# Patient Record
Sex: Male | Born: 1985
Health system: Southern US, Community
[De-identification: ages and names within clinical notes are randomized; demographics above are authoritative.]

## PROBLEM LIST (undated history)

## (undated) DIAGNOSIS — S060XAA Concussion with loss of consciousness status unknown, initial encounter: Secondary | ICD-10-CM

## (undated) DIAGNOSIS — S060X9A Concussion with loss of consciousness of unspecified duration, initial encounter: Secondary | ICD-10-CM

## (undated) HISTORY — PX: TONSILLECTOMY: SUR1361

## (undated) HISTORY — DX: Concussion with loss of consciousness status unknown, initial encounter: S06.0XAA

## (undated) HISTORY — DX: Concussion with loss of consciousness of unspecified duration, initial encounter: S06.0X9A

---
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
## 2005-02-19 ENCOUNTER — Ambulatory Visit (HOSPITAL_COMMUNITY): Admission: RE | Admit: 2005-02-19 | Discharge: 2005-02-19 | Payer: Self-pay | Admitting: Gastroenterology

## 2005-04-30 ENCOUNTER — Encounter: Admission: RE | Admit: 2005-04-30 | Discharge: 2005-04-30 | Payer: Self-pay | Admitting: Gastroenterology

## 2014-05-14 ENCOUNTER — Ambulatory Visit (INDEPENDENT_AMBULATORY_CARE_PROVIDER_SITE_OTHER): Payer: Self-pay | Admitting: Physician Assistant

## 2014-05-14 VITALS — BP 114/72 | HR 85 | Temp 97.9°F | Resp 16 | Ht 65.0 in | Wt 111.4 lb

## 2014-05-14 DIAGNOSIS — Z0000 Encounter for general adult medical examination without abnormal findings: Secondary | ICD-10-CM

## 2014-05-14 NOTE — Progress Notes (Signed)
   Subjective:    Patient ID: Benjamin Baird, male    DOB: [birth year], 29 y.o.   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   Denies any pmh. No psh. Not on any medications. Has never had less than a 2 year card issued. Denies cp, sob, presyncope, syncope.   BP wnl. HR regular rate/rhythm. Normal ua. Vision (corrected) and hearing exams normal.   Review of Systems See HPI.     Objective:   Physical Exam  Constitutional: He is oriented to person, place, and time. He appears well-developed and well-nourished.  Non-toxic appearance. He does not have a sickly appearance. He does not appear ill. No distress.  BP 114/72 mmHg  Pulse 85  Temp(Src) 97.9 F (36.6 C) (Oral)  Resp 16  Ht 5\' 5"  (1.651 m)  Wt 111 lb 6.4 oz (50.531 kg)  BMI 18.54 kg/m2  SpO2 98%   HENT:  Right Ear: Tympanic membrane normal.  Left Ear: Tympanic membrane normal.  Mouth/Throat: Uvula is midline, oropharynx is clear and moist and mucous membranes are normal.  Eyes: Conjunctivae and EOM are normal. Pupils are equal, round, and reactive to light.  Neck: Trachea normal and normal range of motion. Carotid bruit is not present.  Cardiovascular: Normal rate, regular rhythm and normal heart sounds.  Exam reveals no gallop.   No murmur heard. Pulses:      Dorsalis pedis pulses are 2+ on the right side, and 2+ on the left side.       Posterior tibial pulses are 2+ on the right side, and 2+ on the left side.  Pulmonary/Chest: Effort normal and breath sounds normal. No tachypnea.  Abdominal: Soft. Normal appearance and bowel sounds are normal. There is no hepatosplenomegaly. There is no tenderness. No hernia.  Musculoskeletal: Normal range of motion.  Neurological: He is alert and oriented to person, place, and time. He has normal strength. No cranial nerve deficit or sensory deficit. He displays a negative Romberg sign. Coordination and gait normal.  Reflex Scores:      Patellar reflexes are 2+ on the right  side and 2+ on the left side. Psychiatric: He has a normal mood and affect. His speech is normal and behavior is normal.      Assessment & Plan:   89 yom presents for DOT physical.   Physical exam --no concerning pmh or physical exam findings.  --2 year card given  Donnajean Lopesodd M. Keyonia Gluth, PA-C Physician Assistant-Certified [facility]  05/14/2014 5:25 PM

## 2015-04-25 DIAGNOSIS — Z23 Encounter for immunization: Secondary | ICD-10-CM | POA: Diagnosis not present

## 2015-04-25 DIAGNOSIS — G8929 Other chronic pain: Secondary | ICD-10-CM | POA: Diagnosis not present

## 2015-04-25 DIAGNOSIS — M542 Cervicalgia: Secondary | ICD-10-CM | POA: Diagnosis not present

## 2015-05-07 DIAGNOSIS — M542 Cervicalgia: Secondary | ICD-10-CM | POA: Diagnosis not present

## 2015-05-07 DIAGNOSIS — G8929 Other chronic pain: Secondary | ICD-10-CM | POA: Diagnosis not present

## 2015-05-19 DIAGNOSIS — G8929 Other chronic pain: Secondary | ICD-10-CM | POA: Diagnosis not present

## 2015-05-19 DIAGNOSIS — M542 Cervicalgia: Secondary | ICD-10-CM | POA: Diagnosis not present

## 2015-05-19 DIAGNOSIS — M5021 Other cervical disc displacement,  high cervical region: Secondary | ICD-10-CM | POA: Diagnosis not present

## 2015-05-19 DIAGNOSIS — M5031 Other cervical disc degeneration,  high cervical region: Secondary | ICD-10-CM | POA: Diagnosis not present

## 2015-06-02 DIAGNOSIS — F458 Other somatoform disorders: Secondary | ICD-10-CM | POA: Diagnosis not present

## 2015-06-02 DIAGNOSIS — K219 Gastro-esophageal reflux disease without esophagitis: Secondary | ICD-10-CM | POA: Diagnosis not present

## 2015-07-29 DIAGNOSIS — F458 Other somatoform disorders: Secondary | ICD-10-CM | POA: Diagnosis not present

## 2015-07-29 DIAGNOSIS — K219 Gastro-esophageal reflux disease without esophagitis: Secondary | ICD-10-CM | POA: Diagnosis not present

## 2015-08-02 DIAGNOSIS — J069 Acute upper respiratory infection, unspecified: Secondary | ICD-10-CM | POA: Diagnosis not present

## 2015-08-02 DIAGNOSIS — J029 Acute pharyngitis, unspecified: Secondary | ICD-10-CM | POA: Diagnosis not present

## 2015-10-11 ENCOUNTER — Emergency Department (HOSPITAL_COMMUNITY): Payer: BLUE CROSS/BLUE SHIELD

## 2015-10-11 ENCOUNTER — Encounter (HOSPITAL_COMMUNITY): Payer: Self-pay | Admitting: Emergency Medicine

## 2015-10-11 ENCOUNTER — Emergency Department (HOSPITAL_COMMUNITY)
Admission: EM | Admit: 2015-10-11 | Discharge: 2015-10-11 | Disposition: A | Discharge status: Home / Self Care | Payer: BLUE CROSS/BLUE SHIELD | Attending: Emergency Medicine | Admitting: Emergency Medicine

## 2015-10-11 DIAGNOSIS — Z79899 Other long term (current) drug therapy: Secondary | ICD-10-CM | POA: Insufficient documentation

## 2015-10-11 DIAGNOSIS — R51 Headache: Secondary | ICD-10-CM | POA: Diagnosis not present

## 2015-10-11 DIAGNOSIS — S0990XA Unspecified injury of head, initial encounter: Secondary | ICD-10-CM | POA: Diagnosis not present

## 2015-10-11 DIAGNOSIS — R519 Headache, unspecified: Secondary | ICD-10-CM

## 2015-10-11 LAB — I-STAT CHEM 8, ED
BUN: 10 mg/dL (ref 6–20)
CALCIUM ION: 1.18 mmol/L (ref 1.15–1.40)
CREATININE: 0.9 mg/dL (ref 0.61–1.24)
Chloride: 101 mmol/L (ref 101–111)
Glucose, Bld: 72 mg/dL (ref 65–99)
HCT: 45 % (ref 39.0–52.0)
Hemoglobin: 15.3 g/dL (ref 13.0–17.0)
Potassium: 4.1 mmol/L (ref 3.5–5.1)
Sodium: 141 mmol/L (ref 135–145)
TCO2: 29 mmol/L (ref 0–100)

## 2015-10-11 NOTE — ED Triage Notes (Signed)
Pt c/o posterior head injury about 2 weeks ago, no LOC, unsure what he hit his head on. No fall, just hit head. C/o throbbing pain and "bump" at site of injury, no other symtpoms until Sunday when he noted slurred speech, ataxia. Pt reports loss of depth perception to vision noticed on waking, last seen normal 2130 yesterday.

## 2015-10-11 NOTE — ED Provider Notes (Signed)
[facility]-EMERGENCY DEPT Provider Note   CSN: [id number] Arrival date & time: 10/11/15  [id number]     History   Chief Complaint Chief Complaint  Patient presents with  . Head Injury    HPI Benjamin Baird is a 30 y.o. male.  HPI  30 year old male presents for evaluation of headache, blurry vision, ataxia, and slurred speech. Patient states that about 2 weeks ago he murmurs hitting his posterior head. He's not sure exactly what happened or what he hit it on. He knows he did not lose consciousness or fall. He states he hits his head frequently and didn't think anything else of it. However 3 days ago he started having ataxia and feeling off balance. Also noticed a mild posterior headache at the time. The headache has been constant and never gets higher than a 5/10. He has had multiple worse headaches in the past. 2 days ago he started noticing some intermittent slurred speech and trouble speaking. Yesterday he noticed difficulty with his vision which he describes as having poor depth perception all of a sudden. He states there are no visual field cuts or unilateral symptoms. Has also had some photophobia and phonophobia. No fevers. No weakness/numbness. No neck pain/stiffness  History reviewed. No pertinent past medical history.  There are no active problems to display for this patient.   History reviewed. No pertinent surgical history.     Home Medications    Prior to Admission medications   Medication Sig Start Date End Date Taking? Authorizing Provider  ibuprofen (ADVIL,MOTRIN) 200 MG tablet Take 200 mg by mouth daily as needed for headache.   Yes Historical Provider, MD  loratadine (CLARITIN) 10 MG tablet Take 10 mg by mouth daily as needed for allergies.   Yes Historical Provider, MD  omeprazole (PRILOSEC) 40 MG capsule Take 40 mg by mouth daily. 08/30/15  Yes Historical Provider, MD    Family History Family History  Problem Relation Age of Onset  . Hypertension Father     Social  History Social History  Substance Use Topics  . Smoking status: Never Smoker  . Smokeless tobacco: Current User  . Alcohol use No     Allergies   Review of patient's allergies indicates no known allergies.   Review of Systems Review of Systems  Eyes: Positive for photophobia and visual disturbance. Negative for pain.  Gastrointestinal: Negative for nausea.  Musculoskeletal: Positive for gait problem. Negative for neck pain and neck stiffness.  Neurological: Positive for headaches. Negative for weakness and numbness.  All other systems reviewed and are negative.    Physical Exam Updated Vital Signs BP 115/75 (BP Location: Left Arm)   Pulse (!) 52   Temp 98 F (36.7 C) (Oral)   Resp 16   SpO2 100%   Physical Exam  Constitutional: He is oriented to person, place, and time. He appears well-developed and well-nourished.  HENT:  Head: Normocephalic and atraumatic.  Right Ear: External ear normal.  Left Ear: External ear normal.  Nose: Nose normal.  Eyes: EOM are normal. Pupils are equal, round, and reactive to light. Right eye exhibits no discharge. Left eye exhibits no discharge.  Neck: Normal range of motion. Neck supple.  Cardiovascular: Normal rate, regular rhythm and normal heart sounds.   Pulmonary/Chest: Effort normal and breath sounds normal.  Abdominal: Soft. He exhibits no distension. There is no tenderness.  Musculoskeletal: He exhibits no edema.  Neurological: He is alert and oriented to person, place, and time.  CN 3-12 grossly intact.  5/5 strength in all 4 extremities. Grossly normal sensation. Normal finger to nose.   Skin: Skin is warm and dry.  Nursing note and vitals reviewed.    ED Treatments / Results  Labs (all labs ordered are listed, but only abnormal results are displayed) Labs Reviewed  I-STAT CHEM 8, ED    EKG  EKG Interpretation None       Radiology Mr Brain Wo Contrast  Result Date: 10/11/2015 CLINICAL DATA:  Posterior head  injury 2 weeks ago. Throbbing pain. Slurred speech and ataxia 2 days ago. EXAM: MRI HEAD WITHOUT CONTRAST TECHNIQUE: Multiplanar, multiecho pulse sequences of the brain and surrounding structures were obtained without intravenous contrast. COMPARISON:  None available FINDINGS: Brain: No acute infarct or intraparenchymal hemorrhage. The midline structures are normal. There are a few scattered foci of hyperintense T2-weighted signal within the deep white matter, in a nonspecific distribution. While this may be seen in the setting of migraine headaches or early chronic microvascular disease, is also seen in normal patients of this age. No mass lesion or midline shift. No hydrocephalus or extra-axial fluid collection. Vascular: Major intracranial flow voids are preserved. No evidence of chronic microhemorrhage or amyloid angiopathy. Skull and upper cervical spine: The visualized skull base, calvarium, upper cervical spine and extracranial soft tissues are normal. Sinuses/Orbits: No fluid levels or advanced mucosal thickening. No mastoid effusion. Normal orbits. IMPRESSION: 1. No acute intracranial abnormality. 2. 2-3 nonspecific foci of white matter hyperintensity. This may be seen in the setting of migraine headaches or early chronic microvascular disease. It is also seen, at times, in normal patients of this age. The pattern is not typical of demyelinating disease. Electronically Signed   By: Deatra Robinson M.D.   On: 10/11/2015 14:08    Procedures Procedures (including critical care time)  Medications Ordered in ED Medications - No data to display   Initial Impression / Assessment and Plan / ED Course  I have reviewed the triage vital signs and the nursing notes.  Pertinent labs & imaging results that were available during my care of the patient were reviewed by me and considered in my medical decision making (see chart for details).  Clinical Course  Comment By Time  Will consult neuro. Declines pain  meds. Exam currently unremarkable.  Pricilla Loveless, MD 10/03 [phone]  Dr. Desmond Lope recommends MR brain w/o. Otherwise probably post-concussive syndrome. My suspicion for SAH, meningitis, vertebral artery dissection (no neck pain/stiffness) is low.  Pricilla Loveless, MD 10/03 0930    MRI does not show any acute disease including no stroke, mass, bleed, or MS. Patient was encouraged to try ibuprofen or Tylenol given that if this is a complex migraine and might help. Will refer to neurology and discussed return precautions. Likely this is all postconcussive.  Final Clinical Impressions(s) / ED Diagnoses   Final diagnoses:  Occipital headache    New Prescriptions Discharge Medication List as of 10/11/2015  2:45 PM       Pricilla Loveless, MD 10/11/15 1703

## 2015-10-11 NOTE — ED Notes (Signed)
Patient transported to MRI 

## 2015-10-11 NOTE — ED Notes (Signed)
Pt called for triage, not in lobby. Family member states pt went to bathroom,

## 2015-10-14 ENCOUNTER — Encounter: Payer: Self-pay | Admitting: Neurology

## 2015-10-14 ENCOUNTER — Ambulatory Visit (INDEPENDENT_AMBULATORY_CARE_PROVIDER_SITE_OTHER): Payer: BLUE CROSS/BLUE SHIELD | Admitting: Neurology

## 2015-10-14 VITALS — BP 118/72 | HR 72 | Temp 98.1°F | Ht 65.0 in | Wt 114.5 lb

## 2015-10-14 DIAGNOSIS — F0781 Postconcussional syndrome: Secondary | ICD-10-CM | POA: Diagnosis not present

## 2015-10-14 MED ORDER — NAPROXEN 500 MG PO TABS: 500.0000 mg | ORAL_TABLET | Freq: Two times a day (BID) | ORAL | 2 refills | Status: DC | PRN

## 2015-10-14 MED ORDER — AMITRIPTYLINE HCL 10 MG PO TABS: 10.0000 mg | ORAL_TABLET | Freq: Every day | ORAL | 3 refills | Status: DC

## 2015-10-14 NOTE — Patient Instructions (Signed)
1. Start amitriptyline 10mg : take 1 tablet every night 2. Take Naproxen 500mg  twice a day as needed only for severe headaches. Do not take over the counter pain medication more than 2-3 times a week, otherwise headaches may worsen 3. Physical rest and brain rest are of utmost importance after a concussion 4. Follow-up in 2 months, call for any changes
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
## 2015-12-15 ENCOUNTER — Ambulatory Visit: Payer: BLUE CROSS/BLUE SHIELD | Admitting: Neurology

## 2015-12-16 ENCOUNTER — Ambulatory Visit (INDEPENDENT_AMBULATORY_CARE_PROVIDER_SITE_OTHER): Payer: BLUE CROSS/BLUE SHIELD | Admitting: Neurology

## 2015-12-16 ENCOUNTER — Encounter: Payer: Self-pay | Admitting: Neurology

## 2015-12-16 VITALS — BP 112/62 | HR 91 | Ht 65.0 in | Wt 115.1 lb

## 2015-12-16 DIAGNOSIS — F0781 Postconcussional syndrome: Secondary | ICD-10-CM | POA: Diagnosis not present
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
## 2015-12-16 NOTE — Patient Instructions (Signed)
For headaches, you may take any over the counter pain medication, but if you notice you are needing to take it more than 2-3 times a week, please call our office, because using these medications too frequently can worsen headaches instead of making them better.

## 2016-03-17 DIAGNOSIS — A0839 Other viral enteritis: Secondary | ICD-10-CM | POA: Diagnosis not present

## 2016-03-17 DIAGNOSIS — J028 Acute pharyngitis due to other specified organisms: Secondary | ICD-10-CM | POA: Diagnosis not present

## 2016-07-31 DIAGNOSIS — Z23 Encounter for immunization: Secondary | ICD-10-CM | POA: Diagnosis not present

## 2016-08-17 DIAGNOSIS — K219 Gastro-esophageal reflux disease without esophagitis: Secondary | ICD-10-CM | POA: Diagnosis not present

## 2016-08-17 DIAGNOSIS — F458 Other somatoform disorders: Secondary | ICD-10-CM | POA: Diagnosis not present

## 2016-08-24 DIAGNOSIS — M5031 Other cervical disc degeneration,  high cervical region: Secondary | ICD-10-CM | POA: Diagnosis not present

## 2016-09-25 DIAGNOSIS — M5031 Other cervical disc degeneration,  high cervical region: Secondary | ICD-10-CM | POA: Diagnosis not present

## 2016-10-15 DIAGNOSIS — M5021 Other cervical disc displacement,  high cervical region: Secondary | ICD-10-CM | POA: Diagnosis not present

## 2016-11-20 DIAGNOSIS — Z23 Encounter for immunization: Secondary | ICD-10-CM | POA: Diagnosis not present

## 2017-02-04 DIAGNOSIS — K219 Gastro-esophageal reflux disease without esophagitis: Secondary | ICD-10-CM | POA: Diagnosis not present

## 2017-02-04 DIAGNOSIS — M899 Disorder of bone, unspecified: Secondary | ICD-10-CM | POA: Diagnosis not present

## 2017-05-01 DIAGNOSIS — A084 Viral intestinal infection, unspecified: Secondary | ICD-10-CM | POA: Diagnosis not present

## 2017-07-05 DIAGNOSIS — R05 Cough: Secondary | ICD-10-CM | POA: Diagnosis not present

## 2017-07-05 DIAGNOSIS — K219 Gastro-esophageal reflux disease without esophagitis: Secondary | ICD-10-CM | POA: Diagnosis not present

## 2017-12-11 DIAGNOSIS — H109 Unspecified conjunctivitis: Secondary | ICD-10-CM | POA: Diagnosis not present

## 2018-01-31 DIAGNOSIS — R05 Cough: Secondary | ICD-10-CM | POA: Diagnosis not present

## 2018-06-04 DIAGNOSIS — M79604 Pain in right leg: Secondary | ICD-10-CM | POA: Diagnosis not present

## 2018-06-23 DIAGNOSIS — R112 Nausea with vomiting, unspecified: Secondary | ICD-10-CM | POA: Diagnosis not present

## 2018-06-23 DIAGNOSIS — R5383 Other fatigue: Secondary | ICD-10-CM | POA: Diagnosis not present

## 2018-09-04 DIAGNOSIS — M79604 Pain in right leg: Secondary | ICD-10-CM | POA: Diagnosis not present

## 2018-09-04 DIAGNOSIS — R202 Paresthesia of skin: Secondary | ICD-10-CM | POA: Diagnosis not present
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
## 2018-10-14 ENCOUNTER — Other Ambulatory Visit: Payer: Self-pay | Admitting: Neurology

## 2018-10-14 ENCOUNTER — Other Ambulatory Visit: Payer: Self-pay

## 2018-10-14 ENCOUNTER — Ambulatory Visit: Payer: BC Managed Care – PPO | Admitting: Neurology

## 2018-10-14 ENCOUNTER — Ambulatory Visit
Admission: RE | Admit: 2018-10-14 | Discharge: 2018-10-14 | Disposition: A | Discharge status: Home / Self Care | Payer: BC Managed Care – PPO | Source: Ambulatory Visit | Attending: Neurology | Admitting: Neurology

## 2018-10-14 ENCOUNTER — Encounter: Payer: Self-pay | Admitting: Neurology

## 2018-10-14 VITALS — BP 104/65 | HR 76 | Temp 97.5°F | Ht 64.0 in | Wt 113.0 lb

## 2018-10-14 DIAGNOSIS — G8929 Other chronic pain: Secondary | ICD-10-CM

## 2018-10-14 DIAGNOSIS — R200 Anesthesia of skin: Secondary | ICD-10-CM

## 2018-10-14 DIAGNOSIS — M25561 Pain in right knee: Secondary | ICD-10-CM

## 2018-10-14 NOTE — Patient Instructions (Addendum)
[facility] [address] [phone]   Common Peroneal Nerve Entrapment  Common peroneal nerve entrapment is a condition that can make it hard to lift a foot. The condition results from pressure on a nerve in the lower leg called the common peroneal nerve. Your common peroneal nerve provides feeling to your outer lower leg and foot. It also supplies the muscles that move your foot and toes upward and outward. What are the causes? This condition may be caused by:  Sitting cross-legged, squatting, or kneeling for long periods of time.  A hard, direct hit to the side of the lower leg.  Swelling from a knee injury.  A break (fracture) in one of the lower leg bones.  Wearing a boot or cast that ends just below the knee.  A growth or cyst near the nerve. What increases the risk? This condition is more likely to develop in people who play:  Contact sports, such as football or hockey.  Sports where you wear high and stiff boots, such as skiing. What are the signs or symptoms? Symptoms of this condition include:  Trouble lifting your foot up (foot drop).  Tripping often.  Your foot hitting the ground harder than normal as you walk.  Numbness, tingling, or pain in the outside of the knee, outside of the lower leg, and top of the foot.  Sensitivity to pressure on the front or side of the leg. How is this diagnosed? This condition may be diagnosed based on:  Your symptoms.  Your medical history.  A physical exam.  Tests, such as: ? An X-ray to check the bones of your knee and leg. ? MRI to check tendons that attach to the side of your knee. ? An ultrasound to check for a growth or cyst. ? An electromyogram (EMG) to check your nerves. During your physical exam, your health care provider will check for numbness in your leg and test the strength of your lower leg muscles. He or she may tap the side of your lower leg to see if that causes tingling. How is this treated?  Treatment for this condition may include:  Avoiding activities that make symptoms worse.  Using a brace to hold up your foot and toes.  Taking anti-inflammatory pain medicines to relieve swelling and lessen pain.  Having medicines injected into your ankle joint to lessen pain and swelling.  Doing exercises to help you regain or maintain movement (physical therapy).  Surgery to take pressure off the nerve. This may be needed if there is no improvement after 2-3 months or if there is a growth pushing on the nerve.  Returning gradually to full activity. Follow these instructions at home: If you have a brace:  Wear it as told by your health care provider. Remove it only as told by your health care provider.  Loosen the brace if your toes tingle, become numb, or turn cold and blue.  Keep the brace clean.  If the brace is not waterproof: ? Do not let it get wet. ? Cover it with a watertight covering when you take a bath or a shower.  Ask your health care provider when it is safe to drive with a brace on your foot. Activity  Return to your normal activities as told by your health care provider. Ask your health care provider what activities are safe for you.  Do not do any activities that make pain or swelling worse.  Do exercises as told by your health care provider.  General instructions  Take over-the-counter and prescription medicines only as told by your health care provider.  Do not put your full weight on your knee until your health care provider says you can. Use crutches as directed by your health care provider.  Keep all follow-up visits as told by your health care provider. This is important. How is this prevented?  Wear supportive footwear that is appropriate for your athletic activity.  Avoid athletic activities that cause ankle pain or swelling.  Wear protective padding over your lower legs when playing contact sports.  Make sure your boots do not put extra  pressure on the area just below your knees.  Do not sit cross-legged for long periods of time. Contact a health care provider if:  Your symptoms do not get better in 2-3 months.  The weakness or numbness in your leg or foot gets worse. Summary  Common peroneal nerve entrapment is a condition that results from pressure on a nerve in the lower leg called the common peroneal nerve.  This condition may be caused by a hard hit, swelling, a fracture, or a cyst in the lower leg.  Treatment may include rest, a brace, medicines, and physical therapy. Sometimes surgery is needed.  Do not do any activities that make pain or swelling worse. This information is not intended to replace advice given to you by your health care provider. Make sure you discuss any questions you have with your health care provider. Document Released: 12/25/2004 Document Revised: 11/04/2017 Document Reviewed: 11/04/2017 Elsevier Patient Education  2020 [employer].

## 2018-10-15 ENCOUNTER — Telehealth: Payer: Self-pay | Admitting: *Deleted

## 2018-10-15 NOTE — Telephone Encounter (Signed)
-----   Message from Melvenia Beam, MD sent at 10/14/2018  2:20 PM EDT ----- xr shows no cause for symptoms

## 2018-10-15 NOTE — Telephone Encounter (Signed)
Called and spoke with pt about xray results per AA,MD note. Pt verbalized understanding.

## 2018-10-18 LAB — METHYLMALONIC ACID, SERUM: Methylmalonic Acid: 72 nmol/L (ref 0–378)

## 2018-10-18 LAB — B12 AND FOLATE PANEL
Folate: 13.2 ng/mL (ref >3.0)
Vitamin B-12: 663 pg/mL (ref 232–1245)

## 2019-12-15 DIAGNOSIS — R059 Cough, unspecified: Secondary | ICD-10-CM | POA: Diagnosis not present

## 2019-12-15 DIAGNOSIS — R112 Nausea with vomiting, unspecified: Secondary | ICD-10-CM | POA: Diagnosis not present

## 2019-12-15 DIAGNOSIS — J029 Acute pharyngitis, unspecified: Secondary | ICD-10-CM | POA: Diagnosis not present

## 2019-12-23 DIAGNOSIS — M5412 Radiculopathy, cervical region: Secondary | ICD-10-CM | POA: Diagnosis not present

## 2020-01-21 DIAGNOSIS — U071 COVID-19: Secondary | ICD-10-CM | POA: Diagnosis not present

## 2020-01-21 DIAGNOSIS — J069 Acute upper respiratory infection, unspecified: Secondary | ICD-10-CM | POA: Diagnosis not present

## 2020-03-12 DIAGNOSIS — M5412 Radiculopathy, cervical region: Secondary | ICD-10-CM | POA: Diagnosis not present

## 2020-03-15 DIAGNOSIS — M5412 Radiculopathy, cervical region: Secondary | ICD-10-CM | POA: Diagnosis not present

## 2020-03-18 DIAGNOSIS — M5030 Other cervical disc degeneration, unspecified cervical region: Secondary | ICD-10-CM | POA: Diagnosis not present

## 2020-08-29 ENCOUNTER — Other Ambulatory Visit: Payer: Self-pay

## 2020-08-29 ENCOUNTER — Encounter (HOSPITAL_COMMUNITY): Payer: Self-pay

## 2020-08-29 ENCOUNTER — Emergency Department (HOSPITAL_COMMUNITY): Payer: BC Managed Care – PPO

## 2020-08-29 ENCOUNTER — Emergency Department (HOSPITAL_COMMUNITY)
Admission: EM | Admit: 2020-08-29 | Discharge: 2020-08-30 | Disposition: A | Discharge status: Home / Self Care | Payer: BC Managed Care – PPO | Attending: Emergency Medicine | Admitting: Emergency Medicine

## 2020-08-29 DIAGNOSIS — R079 Chest pain, unspecified: Secondary | ICD-10-CM | POA: Diagnosis not present

## 2020-08-29 DIAGNOSIS — R0789 Other chest pain: Secondary | ICD-10-CM | POA: Diagnosis not present

## 2020-08-29 LAB — COMPREHENSIVE METABOLIC PANEL
ALT: 16 U/L (ref 0–44)
AST: 24 U/L (ref 15–41)
Albumin: 4.3 g/dL (ref 3.5–5.0)
Alkaline Phosphatase: 51 U/L (ref 38–126)
Anion gap: 9 (ref 5–15)
BUN: 9 mg/dL (ref 6–20)
CO2: 26 mmol/L (ref 22–32)
Calcium: 9.4 mg/dL (ref 8.9–10.3)
Chloride: 103 mmol/L (ref 98–111)
Creatinine, Ser: 0.91 mg/dL (ref 0.61–1.24)
GFR, Estimated: >60 mL/min (ref >60)
Glucose, Bld: 94 mg/dL (ref 70–99)
Potassium: 3.5 mmol/L (ref 3.5–5.1)
Sodium: 138 mmol/L (ref 135–145)
Total Bilirubin: 0.6 mg/dL (ref 0.3–1.2)
Total Protein: 7 g/dL (ref 6.5–8.1)

## 2020-08-29 LAB — CBC WITH DIFFERENTIAL/PLATELET
Abs Immature Granulocytes: 0.03 10*3/uL (ref 0.00–0.07)
Basophils Absolute: 0.1 10*3/uL (ref 0.0–0.1)
Basophils Relative: 1 %
Eosinophils Absolute: 0.2 10*3/uL (ref 0.0–0.5)
Eosinophils Relative: 2 %
HCT: 43.6 % (ref 39.0–52.0)
Hemoglobin: 14.6 g/dL (ref 13.0–17.0)
Immature Granulocytes: 0 %
Lymphocytes Relative: 17 %
Lymphs Abs: 1.8 10*3/uL (ref 0.7–4.0)
MCH: 28.9 pg (ref 26.0–34.0)
MCHC: 33.5 g/dL (ref 30.0–36.0)
MCV: 86.3 fL (ref 80.0–100.0)
Monocytes Absolute: 0.7 10*3/uL (ref 0.1–1.0)
Monocytes Relative: 7 %
Neutro Abs: 7.8 10*3/uL — ABNORMAL HIGH (ref 1.7–7.7)
Neutrophils Relative %: 73 %
Platelets: 241 10*3/uL (ref 150–400)
RBC: 5.05 MIL/uL (ref 4.22–5.81)
RDW: 12.5 % (ref 11.5–15.5)
WBC: 10.6 10*3/uL — ABNORMAL HIGH (ref 4.0–10.5)
nRBC: 0 % (ref 0.0–0.2)

## 2020-08-29 LAB — TROPONIN I (HIGH SENSITIVITY)
Troponin I (High Sensitivity): 3 ng/L (ref <18)
Troponin I (High Sensitivity): 4 ng/L (ref <18)

## 2020-08-29 NOTE — ED Triage Notes (Signed)
Patient complains of left anterior chest pain since Thursday. Was referred from ortho due to the pain, patient went there because he thought he had pulled a muscle. Patient alert and oriented, no associated symptoms

## 2020-08-29 NOTE — ED Provider Notes (Signed)
Emergency Medicine Provider Triage Evaluation Note  Benjamin Baird , a 35 y.o. male  was evaluated in triage.  Pt complains of chest pain since Thursday.  He states that he went to orthopedics today due to the pain thinking he had pulled a muscle and they referred him here.  He denies any specific injury.  His pain was present Thursday, worse Friday and Saturday and better yesterday however does return today.  He denies any nausea or vomiting, pain does not radiate or move.  He states that he has been short of breath when he goes from inside an air conditioned building to outside where he thought he might.  No leg swelling, no recent surgeries or immobilizations.  No hemoptysis.  No recent known sick contacts.  Denies any drug use.  Does use smokeless tobacco.;   Review of Systems  Positive: Chest pain Negative: Vomiting,  Physical Exam  BP (!) 131/97 (BP Location: Right Arm)   Pulse 89   Temp 98.4 F (36.9 C) (Oral)   Resp 17   SpO2 100%  Gen:   Awake, no distress   Resp:  Normal effort  MSK:   Moves extremities without difficulty  Other:  Regular rate rhythm, no murmurs gallops or rubs.  Medical Decision Making  Medically screening exam initiated at 5:05 PM.  Appropriate orders placed.  JACQUELYN ANTONY was informed that the remainder of the evaluation will be completed by another provider, this initial triage assessment does not replace that evaluation, and the importance of remaining in the ED until their evaluation is complete.  Note: Portions of this report may have been transcribed using voice recognition software. Every effort was made to ensure accuracy; however, inadvertent computerized transcription errors may be present     Norman Clay 08/29/20 1707    Koleen Distance, MD 08/29/20 [phone]

## 2020-08-30 NOTE — Discharge Instructions (Addendum)
You were evaluated in the Emergency Department and after careful evaluation, we did not find any emergent condition requiring admission or further testing in the hospital.  Your exam/testing today was overall reassuring.  Recommend Tylenol or Motrin as needed for pain.  Please return to the Emergency Department if you experience any worsening of your condition.  Thank you for allowing [place] to be a part of your care.

## 2020-08-30 NOTE — ED Provider Notes (Signed)
[facility]EMER[facility]ENC[facility] DEPT [facility] Emergency Department Provider Note MRN:  [id number]  Arrival date & time: 08/30/20     Chief Complaint   Chest pain History of Present Illness   Benjamin Baird is a 35 y.o. year-old male with no pertinent past medical history presenting to the ED with chief complaint of chest pain.  Location: Left upper chest Duration: 3 to 4 days Onset: Gradual Timing: Constant Description: Ache Severity: Moderate Exacerbating/Alleviating Factors: Worse with certain movements or positions Associated Symptoms: None Pertinent Negatives: No dizziness or diaphoresis, no nausea vomiting, no trouble breathing, no leg pain or swelling, no abdominal pain, no numbness or weakness to the arms or legs   Review of Systems  A complete 10 system review of systems was obtained and all systems are negative except as noted in the HPI and PMH.   Patient's Health History    Past Medical History:  Diagnosis Date   Concussion     Past Surgical History:  Procedure Laterality Date   TONSILLECTOMY     childhood    Family History  Problem Relation Age of Onset   Hypertension Father    Neuropathy Neg Hx        none that he is aware of    Social History   Socioeconomic History   Marital status: Married    Spouse name: Not on file   Number of children: 1   Years of education: Not on file   Highest education level: High school graduate  Occupational History   Not on file  Tobacco Use   Smoking status: Never   Smokeless tobacco: Current  Vaping Use   Vaping Use: Never used  Substance and Sexual Activity   Alcohol use: Yes    Alcohol/week: 0.0 standard drinks    Comment: "rarely"   Drug use: No   Sexual activity: Not on file  Other Topics Concern   Not on file  Social History Narrative   Lives at home with wife & son   Left handed   Caffeine: several sodas daily   Social Determinants of Health   Financial Resource Strain: Not on file  Food Insecurity: Not  on file  Transportation Needs: Not on file  Physical Activity: Not on file  Stress: Not on file  Social Connections: Not on file  Intimate Partner Violence: Not on file     Physical Exam   Vitals:   08/29/20 2242 08/30/20 0058  BP: 117/69 111/73  Pulse: (!) 51 71  Resp: 20 18  Temp:    SpO2: 100% 100%    CONSTITUTIONAL: Well-appearing, NAD NEURO:  Alert and oriented x 3, no focal deficits EYES:  eyes equal and reactive ENT/NECK:  no LAD, no JVD CARDIO: Regular rate, well-perfused, normal S1 and S2 PULM:  CTAB no wheezing or rhonchi GI/GU:  normal bowel sounds, non-distended, non-tender MSK/SPINE:  No gross deformities, no edema SKIN:  no rash, atraumatic PSYCH:  Appropriate speech and behavior  *Additional and/or pertinent findings included in MDM below  Diagnostic and Interventional Summary    EKG Interpretation  Date/Time:  Monday August 29 2020 16:53:09 EDT Ventricular Rate:  87 PR Interval:  148 QRS Duration: 96 QT Interval:  354 QTC Calculation: 425 R Axis:   120 Text Interpretation: Normal sinus rhythm Right atrial enlargement Right axis deviation Pulmonary disease pattern Incomplete right bundle branch block Minimal voltage criteria for LVH, may be normal variant ( Cornell product ) Abnormal ECG No previous ECGs available Confirmed by  Tilden Fossa [id number]) on 08/30/2020 1:45:22 AM       Labs Reviewed  CBC WITH DIFFERENTIAL/PLATELET - Abnormal; Notable for the following components:      Result Value   WBC 10.6 (*)    Neutro Abs 7.8 (*)    All other components within normal limits  COMPREHENSIVE METABOLIC PANEL  TROPONIN I (HIGH SENSITIVITY)  TROPONIN I (HIGH SENSITIVITY)    DG Chest 2 View  Final Result      Medications - No data to display   Procedures  /  Critical Care Procedures  ED Course and Medical Decision Making  I have reviewed the triage vital signs, the nursing notes, and pertinent available records from the EMR.  Listed above are  laboratory and imaging tests that I personally ordered, reviewed, and interpreted and then considered in my medical decision making (see below for details).  35 year old with positional chest pain, worse with arm movement, consistent with MSK related pain.  EKG reassuring, troponin negative x2.  Nothing to suggest PE or dissection or other emergent process, appropriate for discharge       Elmer Sow. Pilar Plate, MD [facility] Emergency Medicine [facility] [email or web address]  Final Clinical Impressions(s) / ED Diagnoses     ICD-10-CM   1. Chest pain, unspecified type  R07.9       ED Discharge Orders     None        Discharge Instructions Discussed with and Provided to Patient:    Discharge Instructions      You were evaluated in the Emergency Department and after careful evaluation, we did not find any emergent condition requiring admission or further testing in the hospital.  Your exam/testing today was overall reassuring.  Recommend Tylenol or Motrin as needed for pain.  Please return to the Emergency Department if you experience any worsening of your condition.  Thank you for allowing [place] to be a part of your care.        Sabas Sous, MD 08/30/20 [phone]

## 2020-11-14 DIAGNOSIS — J111 Influenza due to unidentified influenza virus with other respiratory manifestations: Secondary | ICD-10-CM | POA: Diagnosis not present

## 2021-04-28 DIAGNOSIS — H40033 Anatomical narrow angle, bilateral: Secondary | ICD-10-CM | POA: Diagnosis not present

## 2021-04-28 DIAGNOSIS — H04123 Dry eye syndrome of bilateral lacrimal glands: Secondary | ICD-10-CM | POA: Diagnosis not present

## 2021-08-06 IMAGING — CR DG KNEE COMPLETE 4+V*R*
4 series · 4 of 4 positions shown · non-contrast
Comparison: None

CLINICAL DATA: The pain, numbness.

EXAM:
RIGHT KNEE - COMPLETE 4+ VIEW

[w knee ap right]
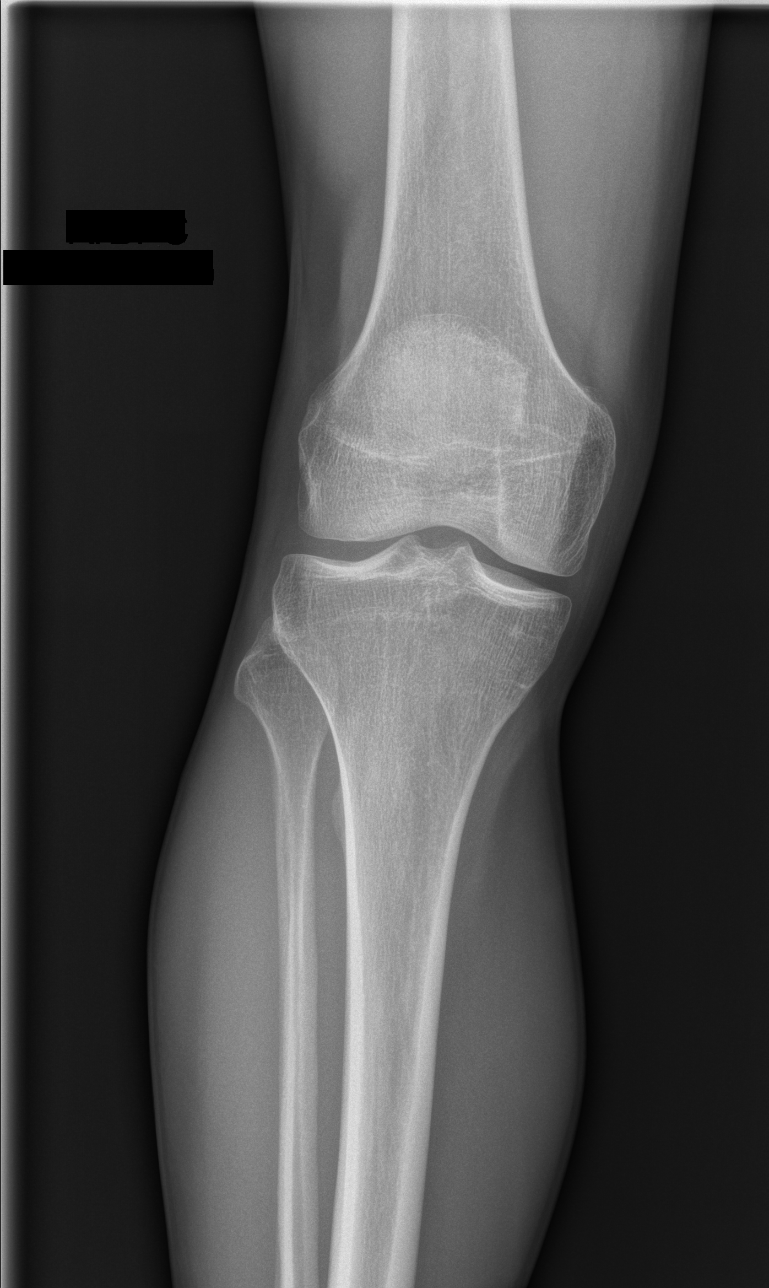

[w knee lat right]
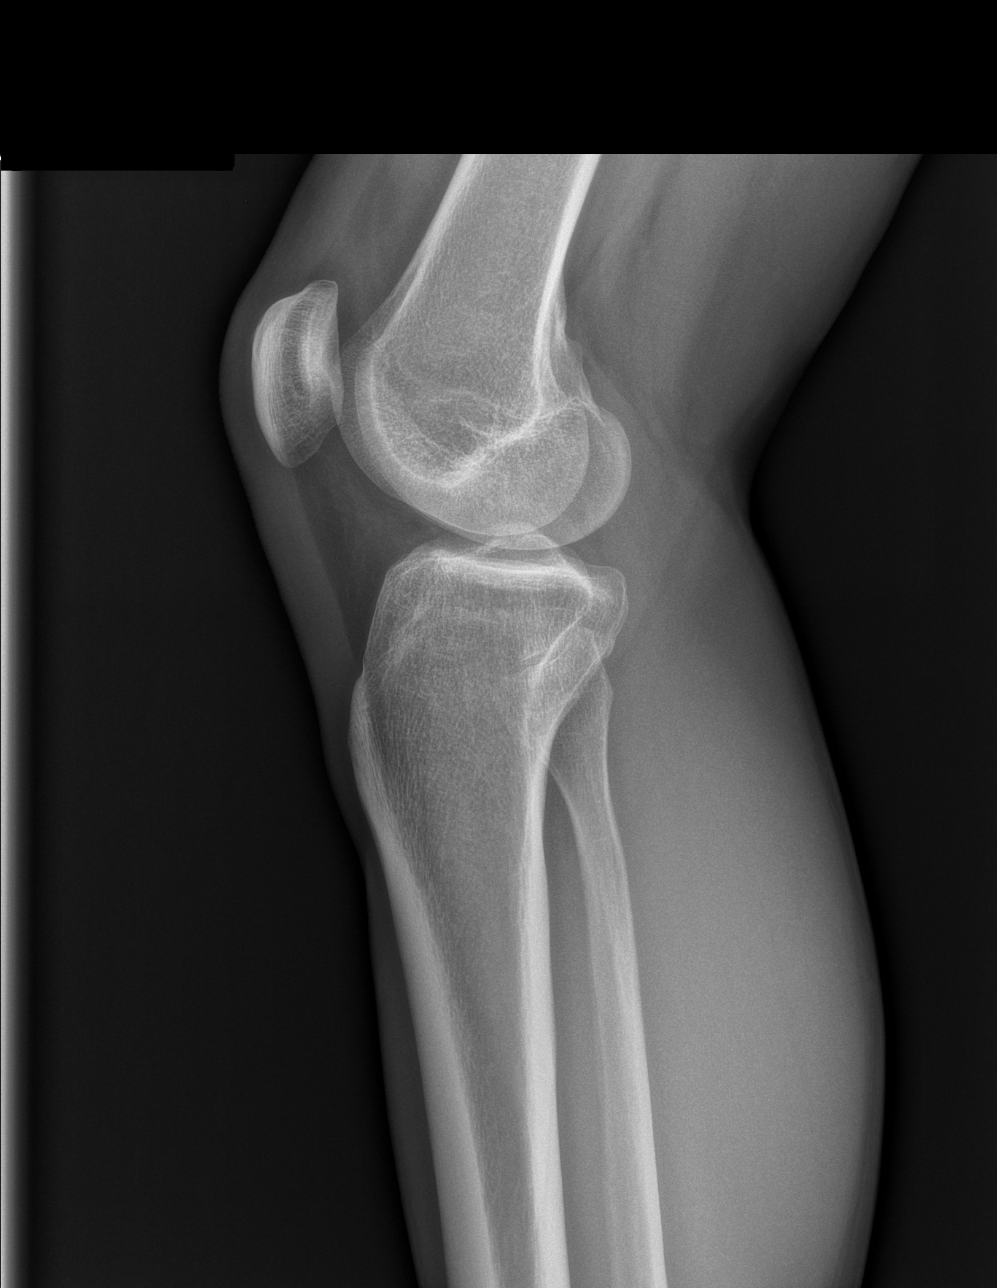

[w knee tunnel pa right]
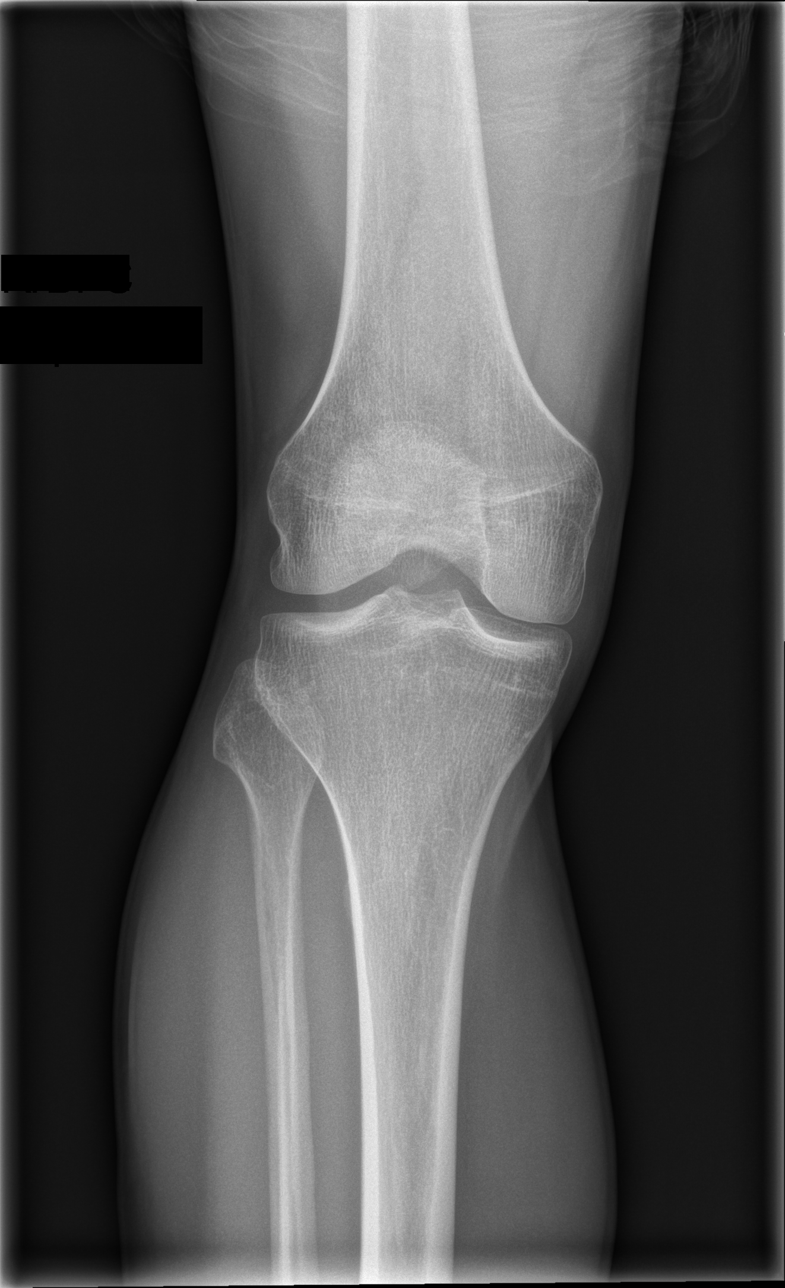

[x knee sunrise right]
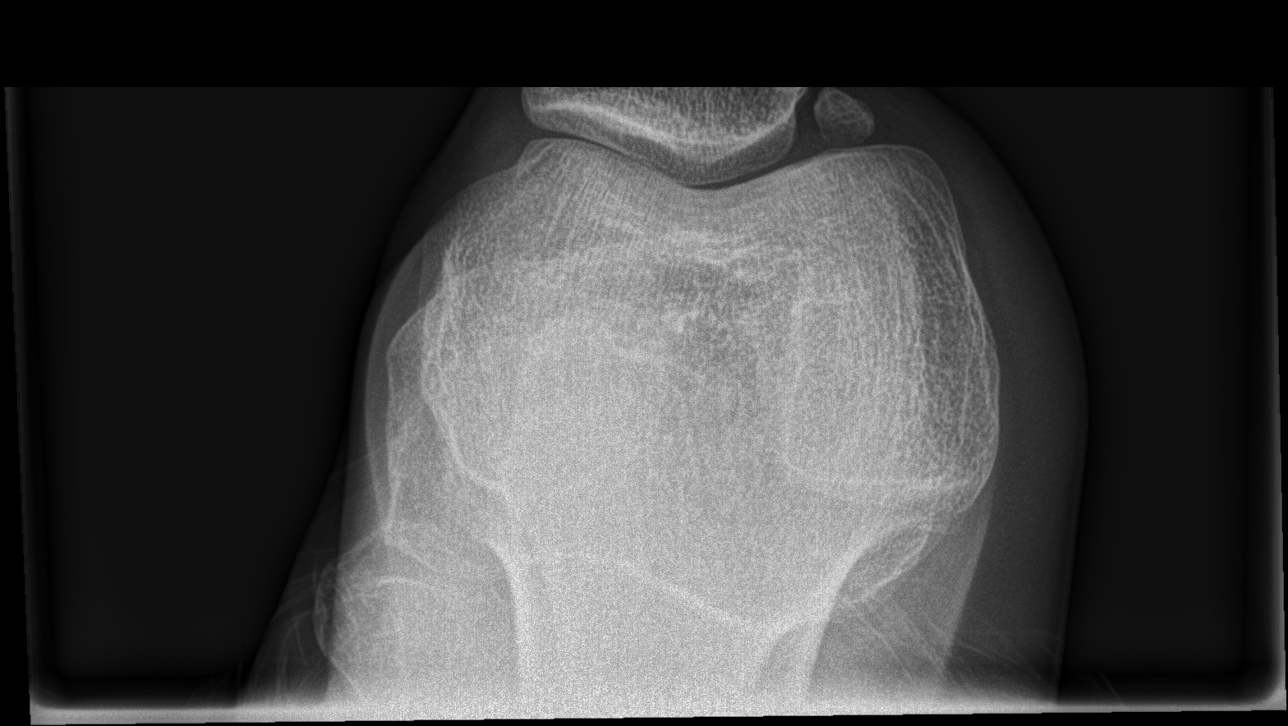

[4 of 4 positions shown; findings below may reference images not displayed]

FINDINGS: No signs of acute fracture or subluxation. There is a well
corticated bone fragment along the medial margin of the patella with
an appearance compatible with accessory ossicle of the patella,
unfused. No signs of joint effusion. Soft tissues are normal.
IMPRESSION: No signs of acute fracture with normal variant patellar ossicle as
described.

## 2021-11-28 DIAGNOSIS — J205 Acute bronchitis due to respiratory syncytial virus: Secondary | ICD-10-CM | POA: Diagnosis not present

## 2021-11-28 DIAGNOSIS — R051 Acute cough: Secondary | ICD-10-CM | POA: Diagnosis not present

## 2021-11-28 DIAGNOSIS — R52 Pain, unspecified: Secondary | ICD-10-CM | POA: Diagnosis not present

## 2021-12-08 DIAGNOSIS — F902 Attention-deficit hyperactivity disorder, combined type: Secondary | ICD-10-CM | POA: Diagnosis not present

## 2022-01-09 DIAGNOSIS — Z13220 Encounter for screening for lipoid disorders: Secondary | ICD-10-CM | POA: Diagnosis not present

## 2022-01-09 DIAGNOSIS — Z0000 Encounter for general adult medical examination without abnormal findings: Secondary | ICD-10-CM | POA: Diagnosis not present

## 2022-01-09 DIAGNOSIS — Z23 Encounter for immunization: Secondary | ICD-10-CM | POA: Diagnosis not present

## 2022-01-09 DIAGNOSIS — F902 Attention-deficit hyperactivity disorder, combined type: Secondary | ICD-10-CM | POA: Diagnosis not present

## 2022-02-27 DIAGNOSIS — M542 Cervicalgia: Secondary | ICD-10-CM | POA: Diagnosis not present

## 2022-02-27 DIAGNOSIS — M5412 Radiculopathy, cervical region: Secondary | ICD-10-CM | POA: Diagnosis not present

## 2022-03-27 DIAGNOSIS — M542 Cervicalgia: Secondary | ICD-10-CM | POA: Diagnosis not present

## 2022-04-02 DIAGNOSIS — K529 Noninfective gastroenteritis and colitis, unspecified: Secondary | ICD-10-CM | POA: Diagnosis not present

## 2022-04-10 DIAGNOSIS — M542 Cervicalgia: Secondary | ICD-10-CM | POA: Diagnosis not present

## 2022-04-17 DIAGNOSIS — M542 Cervicalgia: Secondary | ICD-10-CM | POA: Diagnosis not present

## 2022-04-24 DIAGNOSIS — M542 Cervicalgia: Secondary | ICD-10-CM | POA: Diagnosis not present

## 2022-05-01 DIAGNOSIS — M542 Cervicalgia: Secondary | ICD-10-CM | POA: Diagnosis not present

## 2022-06-18 DIAGNOSIS — J069 Acute upper respiratory infection, unspecified: Secondary | ICD-10-CM | POA: Diagnosis not present

## 2022-11-19 DIAGNOSIS — M5412 Radiculopathy, cervical region: Secondary | ICD-10-CM | POA: Diagnosis not present

## 2022-11-29 DIAGNOSIS — M5412 Radiculopathy, cervical region: Secondary | ICD-10-CM | POA: Diagnosis not present

## 2023-01-11 DIAGNOSIS — Z0000 Encounter for general adult medical examination without abnormal findings: Secondary | ICD-10-CM | POA: Diagnosis not present

## 2023-02-07 DIAGNOSIS — R112 Nausea with vomiting, unspecified: Secondary | ICD-10-CM | POA: Diagnosis not present

## 2023-02-07 DIAGNOSIS — R053 Chronic cough: Secondary | ICD-10-CM | POA: Diagnosis not present

## 2023-02-07 DIAGNOSIS — Z20822 Contact with and (suspected) exposure to covid-19: Secondary | ICD-10-CM | POA: Diagnosis not present

## 2023-04-15 DIAGNOSIS — H9202 Otalgia, left ear: Secondary | ICD-10-CM | POA: Diagnosis not present

## 2023-04-15 DIAGNOSIS — Z681 Body mass index (BMI) 19 or less, adult: Secondary | ICD-10-CM | POA: Diagnosis not present

## 2023-05-30 DIAGNOSIS — F902 Attention-deficit hyperactivity disorder, combined type: Secondary | ICD-10-CM | POA: Diagnosis not present

## 2023-06-22 IMAGING — CR DG CHEST 2V
2 series · 2 of 2 positions shown · non-contrast
Comparison: None.

CLINICAL DATA: Left-sided chest pain for 1 day.  Nonsmoker.

EXAM:
CHEST - 2 VIEW

[chest pa]
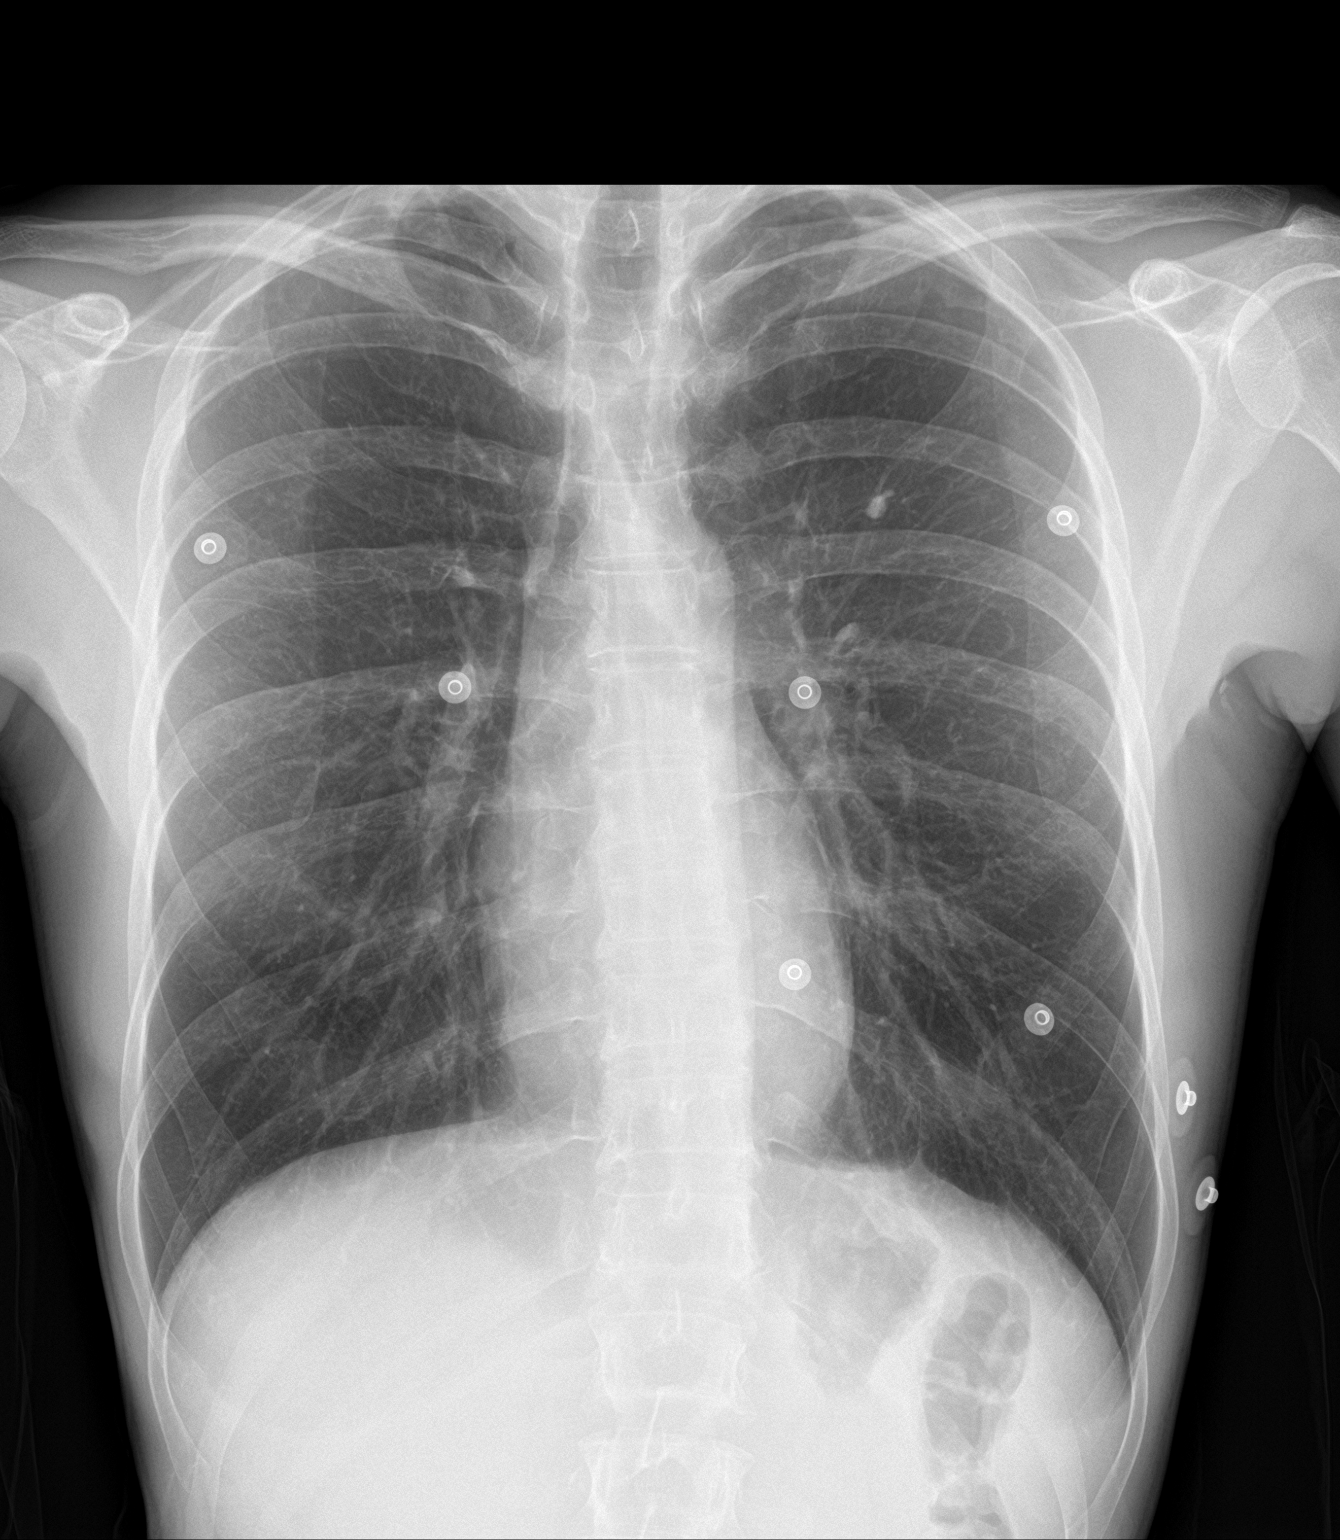

[chest lat]
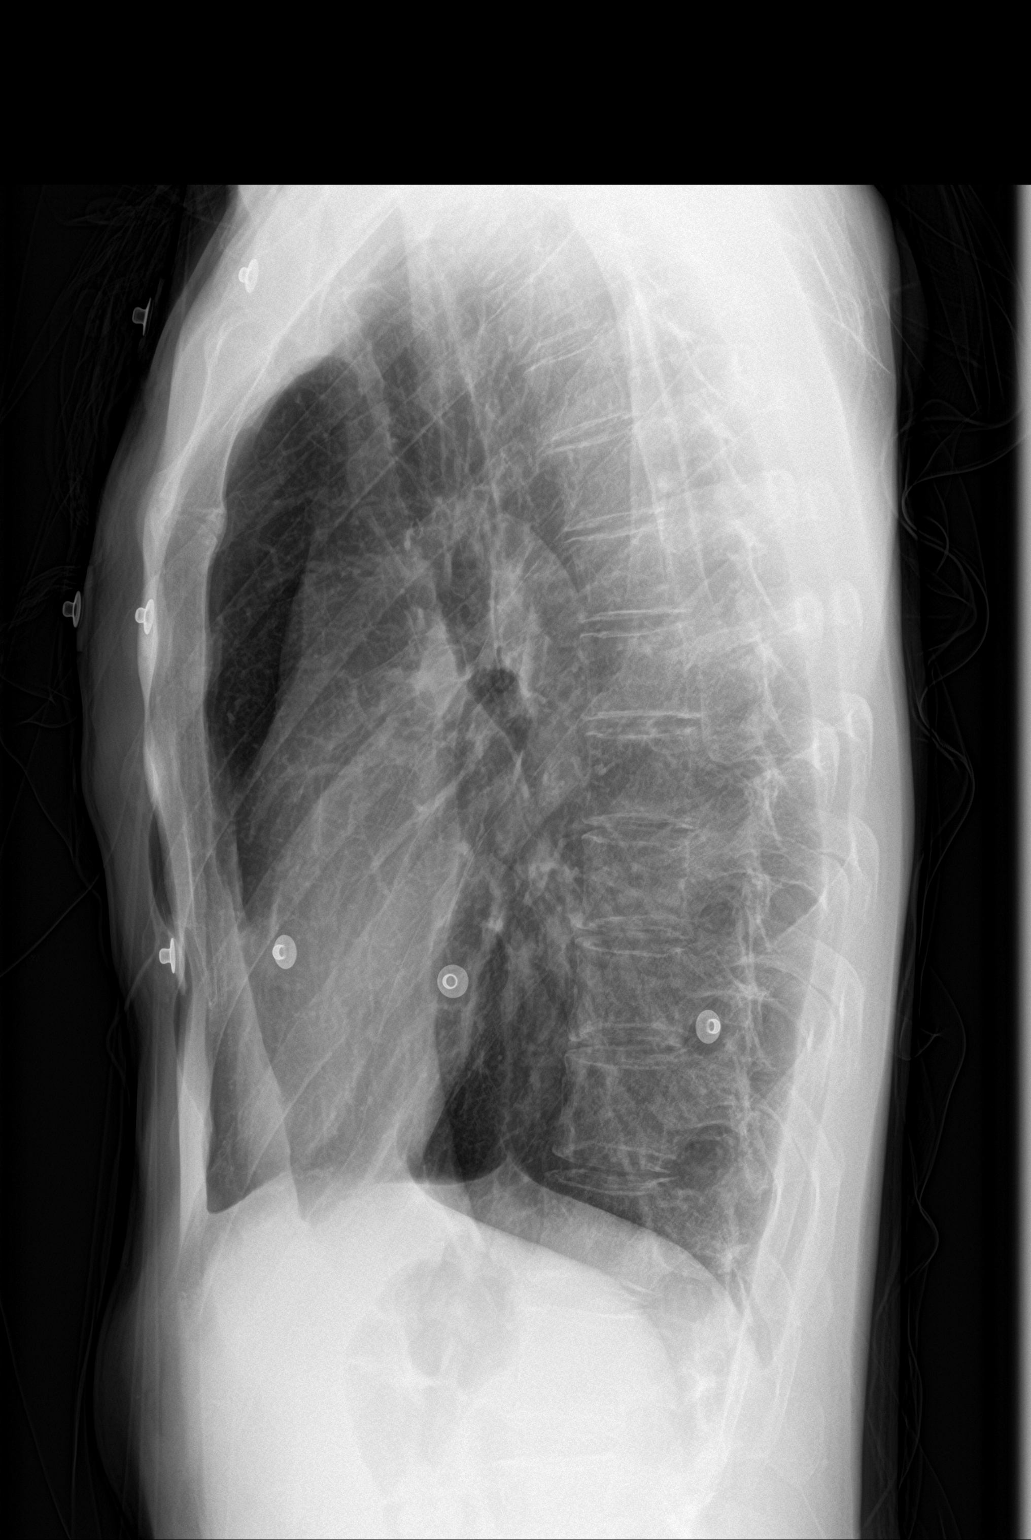

[2 of 2 positions shown; findings below may reference images not displayed]

FINDINGS: Normal heart size and pulmonary vascularity. Pulmonary
hyperinflation suggesting emphysema or asthma. No airspace disease
or consolidation. Calcified granuloma suggested in the left upper
lung. No pleural effusions. No pneumothorax. Mediastinal contours
appear intact.
IMPRESSION: Hyperinflation.  No evidence of active pulmonary disease.

## 2023-09-12 DIAGNOSIS — M5412 Radiculopathy, cervical region: Secondary | ICD-10-CM | POA: Diagnosis not present

## 2023-10-03 DIAGNOSIS — M5412 Radiculopathy, cervical region: Secondary | ICD-10-CM | POA: Diagnosis not present

## 2023-12-03 DIAGNOSIS — K529 Noninfective gastroenteritis and colitis, unspecified: Secondary | ICD-10-CM | POA: Diagnosis not present
